# Patient Record
Sex: Male | Born: 1935 | Race: White | Hispanic: No | Marital: Married | Smoking: Never smoker
Health system: Southern US, Community
[De-identification: ages and names within clinical notes are randomized; demographics above are authoritative.]

## PROBLEM LIST (undated history)

## (undated) DIAGNOSIS — R569 Unspecified convulsions: Secondary | ICD-10-CM

## (undated) DIAGNOSIS — Z5189 Encounter for other specified aftercare: Secondary | ICD-10-CM

## (undated) HISTORY — PX: CARDIAC SURGERY: SHX584

## (undated) HISTORY — PX: PACEMAKER INSERTION: SHX728

---

## 2017-12-19 ENCOUNTER — Other Ambulatory Visit: Payer: Self-pay

## 2017-12-19 ENCOUNTER — Emergency Department: Payer: Medicare Other

## 2017-12-19 ENCOUNTER — Encounter: Payer: Self-pay | Admitting: Emergency Medicine

## 2017-12-19 ENCOUNTER — Emergency Department
Admission: EM | Admit: 2017-12-19 | Discharge: 2017-12-19 | Disposition: A | Discharge status: Home / Self Care | Payer: Medicare Other | Attending: Emergency Medicine | Admitting: Emergency Medicine

## 2017-12-19 DIAGNOSIS — R609 Edema, unspecified: Secondary | ICD-10-CM | POA: Insufficient documentation

## 2017-12-19 DIAGNOSIS — R0600 Dyspnea, unspecified: Secondary | ICD-10-CM | POA: Insufficient documentation

## 2017-12-19 DIAGNOSIS — I110 Hypertensive heart disease with heart failure: Secondary | ICD-10-CM | POA: Diagnosis not present

## 2017-12-19 DIAGNOSIS — Z950 Presence of cardiac pacemaker: Secondary | ICD-10-CM | POA: Diagnosis not present

## 2017-12-19 DIAGNOSIS — I509 Heart failure, unspecified: Secondary | ICD-10-CM | POA: Diagnosis not present

## 2017-12-19 DIAGNOSIS — R0602 Shortness of breath: Secondary | ICD-10-CM | POA: Diagnosis present

## 2017-12-19 HISTORY — DX: Unspecified convulsions: R56.9

## 2017-12-19 HISTORY — DX: Encounter for other specified aftercare: Z51.89

## 2017-12-19 LAB — COMPREHENSIVE METABOLIC PANEL
ALBUMIN: 3.8 g/dL (ref 3.5–5.0)
ALK PHOS: 143 U/L — AB (ref 38–126)
ALT: 18 U/L (ref 0–44)
AST: 30 U/L (ref 15–41)
Anion gap: 9 (ref 5–15)
BILIRUBIN TOTAL: 1.4 mg/dL — AB (ref 0.3–1.2)
BUN: 19 mg/dL (ref 8–23)
CO2: 24 mmol/L (ref 22–32)
CREATININE: 1.02 mg/dL (ref 0.61–1.24)
Calcium: 9.3 mg/dL (ref 8.9–10.3)
Chloride: 109 mmol/L (ref 98–111)
GFR calc Af Amer: >60 mL/min (ref >60)
Glucose, Bld: 131 mg/dL — ABNORMAL HIGH (ref 70–99)
POTASSIUM: 4.2 mmol/L (ref 3.5–5.1)
Sodium: 142 mmol/L (ref 135–145)
TOTAL PROTEIN: 7.6 g/dL (ref 6.5–8.1)

## 2017-12-19 LAB — CBC
HEMATOCRIT: 37.5 % — AB (ref 40.0–52.0)
HEMOGLOBIN: 12.7 g/dL — AB (ref 13.0–18.0)
MCH: 32.2 pg (ref 26.0–34.0)
MCHC: 33.8 g/dL (ref 32.0–36.0)
MCV: 95.2 fL (ref 80.0–100.0)
Platelets: 166 10*3/uL (ref 150–440)
RBC: 3.94 MIL/uL — AB (ref 4.40–5.90)
RDW: 14.6 % — ABNORMAL HIGH (ref 11.5–14.5)
WBC: 5.3 10*3/uL (ref 3.8–10.6)

## 2017-12-19 LAB — URINALYSIS, COMPLETE (UACMP) WITH MICROSCOPIC
Bacteria, UA: NONE SEEN
Bilirubin Urine: NEGATIVE
GLUCOSE, UA: NEGATIVE mg/dL
HGB URINE DIPSTICK: NEGATIVE
Ketones, ur: NEGATIVE mg/dL
LEUKOCYTES UA: NEGATIVE
Nitrite: NEGATIVE
PROTEIN: NEGATIVE mg/dL
SPECIFIC GRAVITY, URINE: 1.005 (ref 1.005–1.030)
Squamous Epithelial / LPF: NONE SEEN (ref 0–5)
pH: 7 (ref 5.0–8.0)

## 2017-12-19 LAB — TROPONIN I: Troponin I: 0.04 ng/mL (ref <0.03)

## 2017-12-19 LAB — BRAIN NATRIURETIC PEPTIDE: B NATRIURETIC PEPTIDE 5: 588 pg/mL — AB (ref 0.0–100.0)

## 2017-12-19 MED ORDER — FUROSEMIDE 10 MG/ML IJ SOLN
60.0000 mg | Freq: Once | INTRAMUSCULAR | Status: AC
Start: 2017-12-19 — End: 2017-12-19
  Administered 2017-12-19: 60 mg via INTRAVENOUS
  Filled 2017-12-19: qty 8

## 2017-12-19 MED ORDER — FUROSEMIDE 20 MG PO TABS: 20.0000 mg | ORAL_TABLET | Freq: Every day | ORAL | 0 refills | Status: DC

## 2017-12-19 NOTE — ED Provider Notes (Signed)
[facility] Emergency Department Provider Note  Time seen: 4:16 PM  I have reviewed the triage vital signs and the nursing notes.   HISTORY  Chief Complaint Shortness of Breath    HPI Jacob Mahoney is a 84 y.o. male with a past medical history of CHF/peripheral edema, hypertension, seizure disorder, presents to the emergency department for shortness of breath.  According to the daughter patient lives in a nursing facility.  Over the past 2 to 3 days the patient has had worsening shortness of breath especially with exertion.  Denies cough or congestion.  Denies fever.  Denies chest pain or abdominal pain.  Does state increased lower extremity swelling especially in the left lower extremity.  Describes his shortness of breath is moderate with exertion, still able to walk to the dining hall from his room but it has become increasingly difficult over the past few days per daughter.   Past Medical History:  Diagnosis Date  . Blood transfusion without reported diagnosis   . Seizures (HCC)     There are no active problems to display for this patient.   Past Surgical History:  Procedure Laterality Date  . CARDIAC SURGERY    . PACEMAKER INSERTION      Prior to Admission medications   Not on File    Allergies  Allergen Reactions  . Amiodarone Rash    History reviewed. No pertinent family history.  Social History Social History   Tobacco Use  . Smoking status: Never Smoker  . Smokeless tobacco: Never Used  Substance Use Topics  . Alcohol use: Never    Frequency: Never  . Drug use: Never    Review of Systems Constitutional: Negative for fever. Eyes: Negative for visual complaints ENT: Negative for recent illness/congestion Cardiovascular: Negative for chest pain. Respiratory: As if her shortness of breath, worse with exertion. Gastrointestinal: Negative for abdominal pain, vomiting Genitourinary: Dark urine. Musculoskeletal: As if her lower  extremity edema left greater than right. Skin: Negative for skin complaints  Neurological: Negative for headache All other ROS negative  ____________________________________________   PHYSICAL EXAM:  VITAL SIGNS: ED Triage Vitals  Enc Vitals Group     BP 12/19/17 1443 (!) 153/88     Pulse Rate 12/19/17 1443 61     Resp 12/19/17 1443 16     Temp 12/19/17 1443 98.1 F (36.7 C)     Temp Source 12/19/17 1443 Oral     SpO2 12/19/17 1443 97 %     Weight 12/19/17 1445 200 lb (90.7 kg)     Height 12/19/17 1445 6' (1.829 m)     Head Circumference --      Peak Flow --      Pain Score 12/19/17 1445 0     Pain Loc --      Pain Edu? --      Excl. in GC? --    Constitutional: Alert and oriented. Well appearing and in no distress. Eyes: Normal exam ENT   Head: Normocephalic and atraumatic   Mouth/Throat: Mucous membranes are moist. Cardiovascular: Normal rate, regular rhythm.  Respiratory: Normal respiratory effort without tachypnea nor retractions. Breath sounds are clear without obvious wheeze rales or rhonchi. Gastrointestinal: Soft and nontender. No distention.   Musculoskeletal: 2+ lower extremity edema in the left lower extremity, 1+ in the right lower extremity.  Mild erythema of the left lower extremity, no significant tenderness. Neurologic:  Normal speech and language. No gross focal neurologic deficits  Skin:  Skin is  warm, dry and intact.  Psychiatric: Mood and affect are normal.  ____________________________________________    EKG  EKG reviewed and interpreted by myself shows a ventricular paced rhythm at 66 bpm with a widened QRS, left axis deviation, QTC prolongation and nonspecific ST changes  ____________________________________________    RADIOLOGY  Chest x-ray consistent with vascular congestion  ____________________________________________   INITIAL IMPRESSION / ASSESSMENT AND PLAN / ED COURSE  Pertinent labs & imaging results that were  available during my care of the patient were reviewed by me and considered in my medical decision making (see chart for details).  Patient presents to the emergency department for shortness of breath worse over the past 2 to 3 days.  Differential would include CHF exacerbation, pulmonary edema, ACS, alleged letter metabolic abnormality, infectious etiology.  Patient's labs are resulted showing an elevated BNP of 588, vascular congestion on chest x-ray with increased lower extremity edema, left greater than right.  We will obtain an ultrasound to help rule out DVT.  Chest x-ray shows vascular congestion highly suspect mild CHF exacerbation to be the cause of the patient's symptoms.  Kidney function is normal, letter lites are reassuring.  We will dose IV Lasix, obtain an ultrasound of the left lower extremity as well as a urinalysis.  Patient agreeable to plan of care.  Overall the patient appears well currently satting 98% on room air during my evaluation.  Patient's ultrasound is negative for DVT.  Remainder work-up appears largely nonrevealing.  Overall the patient appears very well, continues to sat in the upper 90s on room air.  We will discharge her on increased Lasix regimen have the patient seen by his primary care doctor later this week.  Daughter and patient are agreeable to this plan of care.  ____________________________________________   FINAL CLINICAL IMPRESSION(S) / ED DIAGNOSES  Dyspnea Peripheral edema   Minna Antis, MD 12/19/17 [phone]

## 2017-12-19 NOTE — Discharge Instructions (Signed)
Please take your Lasix as prescribed each morning over the next 5 days.  Please follow-up with your doctor later this week for recheck/reevaluation.  Return to the emergency department for any worsening trouble breathing development of any chest pain, or any other symptoms personally concerning to yourself.  Please also call the number provided for cardiology to arrange a follow-up appointment for further evaluation and possible echocardiogram/ultrasound of your heart.

## 2017-12-19 NOTE — ED Notes (Signed)
Patient transported to Ultrasound 

## 2017-12-19 NOTE — ED Triage Notes (Signed)
Pt to ED from home c/o SOB x2 weeks getting worse as well as edema in bilateral legs left worse and than right.  Denies pain, speaking in complete and coherent sentences, no obvious distress noted in triage.

## 2019-06-28 ENCOUNTER — Ambulatory Visit: Payer: 59 | Attending: Internal Medicine

## 2019-08-29 ENCOUNTER — Emergency Department: Payer: Medicare HMO

## 2019-08-29 ENCOUNTER — Observation Stay
Admission: EM | Admit: 2019-08-29 | Discharge: 2019-08-31 | Disposition: A | Discharge status: Home / Self Care | Payer: Medicare HMO | Attending: Internal Medicine | Admitting: Internal Medicine

## 2019-08-29 DIAGNOSIS — I110 Hypertensive heart disease with heart failure: Secondary | ICD-10-CM | POA: Insufficient documentation

## 2019-08-29 DIAGNOSIS — I10 Essential (primary) hypertension: Secondary | ICD-10-CM

## 2019-08-29 DIAGNOSIS — L03119 Cellulitis of unspecified part of limb: Secondary | ICD-10-CM | POA: Diagnosis not present

## 2019-08-29 DIAGNOSIS — J90 Pleural effusion, not elsewhere classified: Secondary | ICD-10-CM

## 2019-08-29 DIAGNOSIS — K219 Gastro-esophageal reflux disease without esophagitis: Secondary | ICD-10-CM

## 2019-08-29 DIAGNOSIS — R4189 Other symptoms and signs involving cognitive functions and awareness: Secondary | ICD-10-CM | POA: Diagnosis not present

## 2019-08-29 DIAGNOSIS — D649 Anemia, unspecified: Secondary | ICD-10-CM | POA: Diagnosis not present

## 2019-08-29 DIAGNOSIS — Z950 Presence of cardiac pacemaker: Secondary | ICD-10-CM | POA: Diagnosis not present

## 2019-08-29 DIAGNOSIS — R4182 Altered mental status, unspecified: Principal | ICD-10-CM | POA: Insufficient documentation

## 2019-08-29 DIAGNOSIS — I5023 Acute on chronic systolic (congestive) heart failure: Secondary | ICD-10-CM | POA: Diagnosis not present

## 2019-08-29 DIAGNOSIS — Z79899 Other long term (current) drug therapy: Secondary | ICD-10-CM | POA: Diagnosis not present

## 2019-08-29 DIAGNOSIS — I5021 Acute systolic (congestive) heart failure: Secondary | ICD-10-CM

## 2019-08-29 DIAGNOSIS — R0602 Shortness of breath: Secondary | ICD-10-CM | POA: Diagnosis present

## 2019-08-29 DIAGNOSIS — Z66 Do not resuscitate: Secondary | ICD-10-CM | POA: Diagnosis not present

## 2019-08-29 DIAGNOSIS — L0390 Cellulitis, unspecified: Secondary | ICD-10-CM

## 2019-08-29 DIAGNOSIS — Z952 Presence of prosthetic heart valve: Secondary | ICD-10-CM | POA: Insufficient documentation

## 2019-08-29 DIAGNOSIS — I501 Left ventricular failure: Secondary | ICD-10-CM

## 2019-08-29 DIAGNOSIS — Z20822 Contact with and (suspected) exposure to covid-19: Secondary | ICD-10-CM | POA: Diagnosis not present

## 2019-08-29 DIAGNOSIS — I509 Heart failure, unspecified: Secondary | ICD-10-CM

## 2019-08-29 DIAGNOSIS — L03116 Cellulitis of left lower limb: Secondary | ICD-10-CM | POA: Diagnosis not present

## 2019-08-29 LAB — TROPONIN I (HIGH SENSITIVITY)
Troponin I (High Sensitivity): 49 ng/L — ABNORMAL HIGH (ref <18)
Troponin I (High Sensitivity): 53 ng/L — ABNORMAL HIGH (ref <18)

## 2019-08-29 LAB — CBC WITH DIFFERENTIAL/PLATELET
Abs Immature Granulocytes: 0.02 10*3/uL (ref 0.00–0.07)
Basophils Absolute: 0.1 10*3/uL (ref 0.0–0.1)
Basophils Relative: 1 %
Eosinophils Absolute: 0.2 10*3/uL (ref 0.0–0.5)
Eosinophils Relative: 3 %
HCT: 36 % — ABNORMAL LOW (ref 39.0–52.0)
Hemoglobin: 11.5 g/dL — ABNORMAL LOW (ref 13.0–17.0)
Immature Granulocytes: 0 %
Lymphocytes Relative: 13 %
Lymphs Abs: 0.9 10*3/uL (ref 0.7–4.0)
MCH: 30.9 pg (ref 26.0–34.0)
MCHC: 31.9 g/dL (ref 30.0–36.0)
MCV: 96.8 fL (ref 80.0–100.0)
Monocytes Absolute: 1.3 10*3/uL — ABNORMAL HIGH (ref 0.1–1.0)
Monocytes Relative: 20 %
Neutro Abs: 4 10*3/uL (ref 1.7–7.7)
Neutrophils Relative %: 63 %
Platelets: 191 10*3/uL (ref 150–400)
RBC: 3.72 MIL/uL — ABNORMAL LOW (ref 4.22–5.81)
RDW: 14.6 % (ref 11.5–15.5)
WBC: 6.4 10*3/uL (ref 4.0–10.5)
nRBC: 0 % (ref 0.0–0.2)

## 2019-08-29 LAB — BASIC METABOLIC PANEL
Anion gap: 6 (ref 5–15)
BUN: 28 mg/dL — ABNORMAL HIGH (ref 8–23)
CO2: 24 mmol/L (ref 22–32)
Calcium: 9.4 mg/dL (ref 8.9–10.3)
Chloride: 108 mmol/L (ref 98–111)
Creatinine, Ser: 1.08 mg/dL (ref 0.61–1.24)
GFR calc Af Amer: >60 mL/min (ref >60)
GFR calc non Af Amer: 58 mL/min — ABNORMAL LOW (ref >60)
Glucose, Bld: 90 mg/dL (ref 70–99)
Potassium: 4.3 mmol/L (ref 3.5–5.1)
Sodium: 138 mmol/L (ref 135–145)

## 2019-08-29 LAB — BRAIN NATRIURETIC PEPTIDE: B Natriuretic Peptide: 535 pg/mL — ABNORMAL HIGH (ref 0.0–100.0)

## 2019-08-29 MED ORDER — ATENOLOL 25 MG PO TABS: 12.5000 mg | ORAL_TABLET | Freq: Every day | ORAL | Status: DC

## 2019-08-29 MED ORDER — DONEPEZIL HCL 5 MG PO TABS
10.0000 mg | ORAL_TABLET | Freq: Every day | ORAL | Status: DC
  Administered 2019-08-29 – 2019-08-30 (×2): 10 mg via ORAL
  Filled 2019-08-29 (×3): qty 2

## 2019-08-29 MED ORDER — ASPIRIN 81 MG PO CHEW
324.0000 mg | CHEWABLE_TABLET | Freq: Once | ORAL | Status: DC
  Filled 2019-08-29: qty 4

## 2019-08-29 MED ORDER — ENOXAPARIN SODIUM 40 MG/0.4ML ~~LOC~~ SOLN
40.0000 mg | SUBCUTANEOUS | Status: DC
  Administered 2019-08-30 (×2): 40 mg via SUBCUTANEOUS
  Filled 2019-08-29 (×2): qty 0.4

## 2019-08-29 MED ORDER — PANTOPRAZOLE SODIUM 40 MG PO TBEC
40.0000 mg | DELAYED_RELEASE_TABLET | Freq: Every day | ORAL | Status: DC
  Administered 2019-08-30 – 2019-08-31 (×2): 40 mg via ORAL
  Filled 2019-08-29 (×2): qty 1

## 2019-08-29 MED ORDER — FUROSEMIDE 10 MG/ML IJ SOLN
40.0000 mg | Freq: Every day | INTRAMUSCULAR | Status: DC
  Administered 2019-08-30 – 2019-08-31 (×2): 40 mg via INTRAVENOUS
  Filled 2019-08-29 (×3): qty 4

## 2019-08-29 MED ORDER — ASPIRIN 81 MG PO CHEW
162.0000 mg | CHEWABLE_TABLET | Freq: Once | ORAL | Status: AC
  Administered 2019-08-29: 162 mg via ORAL

## 2019-08-29 MED ORDER — SODIUM CHLORIDE 0.9 % IV SOLN
1.0000 g | INTRAVENOUS | Status: DC
  Administered 2019-08-29 – 2019-08-30 (×2): 1 g via INTRAVENOUS
  Filled 2019-08-29: qty 10
  Filled 2019-08-29: qty 1
  Filled 2019-08-29: qty 10

## 2019-08-29 MED ORDER — LEVETIRACETAM 500 MG PO TABS
500.0000 mg | ORAL_TABLET | Freq: Two times a day (BID) | ORAL | Status: DC
  Administered 2019-08-29 – 2019-08-31 (×4): 500 mg via ORAL
  Filled 2019-08-29 (×4): qty 1

## 2019-08-29 MED ORDER — ATENOLOL 25 MG PO TABS
12.5000 mg | ORAL_TABLET | Freq: Every day | ORAL | Status: DC
  Administered 2019-08-30 – 2019-08-31 (×3): 12.5 mg via ORAL
  Filled 2019-08-29: qty 1
  Filled 2019-08-29 (×2): qty 0.5

## 2019-08-29 MED ORDER — FUROSEMIDE 10 MG/ML IJ SOLN
60.0000 mg | Freq: Once | INTRAMUSCULAR | Status: AC
  Administered 2019-08-29: 60 mg via INTRAVENOUS
  Filled 2019-08-29: qty 8

## 2019-08-29 MED ORDER — ISOSORBIDE MONONITRATE ER 30 MG PO TB24
30.0000 mg | ORAL_TABLET | Freq: Every day | ORAL | Status: DC
  Administered 2019-08-30 – 2019-08-31 (×2): 30 mg via ORAL
  Filled 2019-08-29 (×2): qty 1

## 2019-08-29 MED ORDER — ASPIRIN 81 MG PO CHEW
81.0000 mg | CHEWABLE_TABLET | Freq: Every day | ORAL | Status: DC
  Administered 2019-08-30 – 2019-08-31 (×2): 81 mg via ORAL
  Filled 2019-08-29 (×2): qty 1

## 2019-08-29 NOTE — ED Notes (Signed)
Pt continued to get up to urinate, condom cath placed, bedding changed, bed alarm active

## 2019-08-29 NOTE — H&P (Signed)
History and Physical    Jacob Mahoney AQT:[id number] DOB: 11-Mar-1923 DOA: 08/29/2019  PCP: Sharilyn Sites, MD  Patient coming from: [facility], has home health aids that helps with medication and daily ADLs  I have personally briefly reviewed patient's old medical records in [facility]  Chief Complaint: altered mental status   HPI: Jacob Mahoney is a 86 y.o. male with medical history significant for chronic systolic heart failure, bradycardia s/p pacemaker, aortic valve replacement, history of GI bleed, seizures disorder and hypertension who presents with concerns of altered mental status.  History obtained mostly from daughter who is his HCPOA since patient gave conflicting stories.  He reports to me that he has been feeling dizzy when lying for the past few days but however told ER physician that he has been having issues with shortness of breath.  Per daughter, patient stays with his wife who has severe dementia in a retirement home and reportedly the housekeeper found him laying on the couch "out of it"  And was smacking his lips.  She then proceeded to get him out of the couch and attempted to ambulate him but he appeared to be short of breath and weak.  Unsure if he had any bowel or urinary incontinent.  Patient does have history of seizure and his last one was about 3 to 4 years ago and symptoms include smacking his lips and twitching of the arm.  He is on Keppra 500 twice daily.  Daughter has noticed that for the past few days he has not quite been "himself."  Patient currently not complaining of any pain.  Denies any chest pain.  States he has mild shortness of breath.  He has noticed increased redness of his left lower extremity and per daughter he was recently diagnosed with cellulitis by his primary care physician and was started on a 10-day course of amoxicillin since 3/4.  ED Course: He was afebrile, normotensive on room air.  No leukocytosis.  Stable anemia  with hemoglobin of 11.5.  Unremarkable BMP with normal creatinine of 1.08. BNP of 535.  Troponin of 49. Chest x-ray shows mild bibasilar subsegmental atelectasis or edema with probable small pleural effusion.  Left lower extremity Doppler ultrasound negative for DVT.  Review of Systems:  Unable to fully obtain as patient is a poor historian unable to recall symptoms  Past Medical History:  Diagnosis Date  . Blood transfusion without reported diagnosis   . Seizures (HCC)     Past Surgical History:  Procedure Laterality Date  . CARDIAC SURGERY    . PACEMAKER INSERTION       reports that he has never smoked. He has never used smokeless tobacco. He reports that he does not drink alcohol or use drugs.  Allergies  Allergen Reactions  . Amiodarone Rash    Unable to obtain family history due to patient's poor recall  Prior to Admission medications   Medication Sig Start Date End Date Taking? Authorizing Provider  amoxicillin (AMOXIL) 500 MG tablet Take 500 mg by mouth 3 (three) times daily.   Yes [provider]  atenolol (TENORMIN) 25 MG tablet Take 12.5 mg by mouth daily.   Yes [provider]  donepezil (ARICEPT) 10 MG tablet Take 10 mg by mouth at bedtime.   Yes [provider]  furosemide (LASIX) 20 MG tablet Take 20 mg by mouth daily.   Yes [provider]  isosorbide mononitrate (IMDUR) 30 MG 24 hr tablet Take 30 mg  by mouth daily. Hold for SBP<90 and /or MAP<70   Yes [provider]  omeprazole (PRILOSEC) 20 MG capsule Take 20 mg by mouth daily.   Yes [provider]    Physical Exam: Vitals:   08/29/19 1750 08/29/19 1900 08/29/19 1930 08/29/19 2000  BP: (!) 119/103 140/85 134/83   Pulse: 74 60 63 64  Resp: 16 17 15    Temp: 97.9 F (36.6 C)     TempSrc: Oral     SpO2: 91% 92% 94% 96%  Weight:      Height:        Constitutional: NAD, calm, comfortable, will elderly pleasant gentleman laying at 45 degree  incline in bed Vitals:   08/29/19 1750 08/29/19 1900 08/29/19 1930 08/29/19 2000  BP: (!) 119/103 140/85 134/83   Pulse: 74 60 63 64  Resp: 16 17 15    Temp: 97.9 F (36.6 C)     TempSrc: Oral     SpO2: 91% 92% 94% 96%  Weight:      Height:       Eyes: PERRL, lids and conjunctivae normal ENMT: Mucous membranes are moist.  Neck: normal, supple Respiratory: clear to auscultation bilaterally, no wheezing, no crackles. Normal respiratory effort on room air Cardiovascular: Regular rate and rhythm, no murmurs / rubs / gallops.  Nonpitting edema of both lower extremity worse on the left.   Abdomen: no tenderness, no masses palpated. Bowel sounds positive.  Musculoskeletal: no clubbing / cyanosis. No joint deformity upper and lower extremities. Good ROM, no contractures. Normal muscle tone.  Skin: Chronic venous stasis changes of the skin of bilateral lower extremity.  Left lower extremity is more circumferentially enlarged compared to the right and has erythema up to the mid pretibial region with increased warmth to touch. Neurologic: CN 2-12 grossly intact. Sensation intact. Strength 5/5 in all 4.  Psychiatric:  Alert and oriented x 4 but has difficulty with recall of current symptoms. Normal pleasant mood.     Labs on Admission: I have personally reviewed following labs and imaging studies  CBC: Recent Labs  Lab 08/29/19 1516  WBC 6.4  NEUTROABS 4.0  HGB 11.5*  HCT 36.0*  MCV 96.8  PLT 585   Basic Metabolic Panel: Recent Labs  Lab 08/29/19 1516  NA 138  K 4.3  CL 108  CO2 24  GLUCOSE 90  BUN 28*  CREATININE 1.08  CALCIUM 9.4   GFR: Estimated Creatinine Clearance: 45.9 mL/min (by C-G formula based on SCr of 1.08 mg/dL). Liver Function Tests: No results for input(s): AST, ALT, ALKPHOS, BILITOT, PROT, ALBUMIN in the last 168 hours. No results for input(s): LIPASE, AMYLASE in the last 168 hours. No results for input(s): AMMONIA in the last 168 hours. Coagulation  Profile: No results for input(s): INR, PROTIME in the last 168 hours. Cardiac Enzymes: No results for input(s): CKTOTAL, CKMB, CKMBINDEX, TROPONINI in the last 168 hours. BNP (last 3 results) No results for input(s): PROBNP in the last 8760 hours. HbA1C: No results for input(s): HGBA1C in the last 72 hours. CBG: No results for input(s): GLUCAP in the last 168 hours. Lipid Profile: No results for input(s): CHOL, HDL, LDLCALC, TRIG, CHOLHDL, LDLDIRECT in the last 72 hours. Thyroid Function Tests: No results for input(s): TSH, T4TOTAL, FREET4, T3FREE, THYROIDAB in the last 72 hours. Anemia Panel: No results for input(s): VITAMINB12, FOLATE, FERRITIN, TIBC, IRON, RETICCTPCT in the last 72 hours. Urine analysis:    Component Value Date/Time   COLORURINE STRAW (A)  12/19/2017 1717   APPEARANCEUR CLEAR (A) 12/19/2017 1717   LABSPEC 1.005 12/19/2017 1717   PHURINE 7.0 12/19/2017 1717   GLUCOSEU NEGATIVE 12/19/2017 1717   HGBUR NEGATIVE 12/19/2017 1717   BILIRUBINUR NEGATIVE 12/19/2017 1717   KETONESUR NEGATIVE 12/19/2017 1717   PROTEINUR NEGATIVE 12/19/2017 1717   NITRITE NEGATIVE 12/19/2017 1717   LEUKOCYTESUR NEGATIVE 12/19/2017 1717    Radiological Exams on Admission: DG Chest 2 View  Result Date: 08/29/2019 CLINICAL DATA:  Shortness of breath. EXAM: CHEST - 2 VIEW COMPARISON:  December 19, 2017. FINDINGS: Stable cardiomegaly. Atherosclerosis of thoracic aorta is noted. Left-sided pacemaker is unchanged. Status post aortic valve repair. No pneumothorax is noted. Mild bibasilar subsegmental atelectasis or edema is noted with probable small pleural effusions. Bony thorax is unremarkable. IMPRESSION: Mild bibasilar subsegmental atelectasis or edema is noted with probable small pleural effusions. Aortic Atherosclerosis (ICD10-I70.0). Electronically Signed   By: Lupita Raider M.D.   On: 08/29/2019 17:00   US Venous Img Lower Unilateral Left  Result Date: 08/29/2019 CLINICAL DATA:  Left lower  extremity edema and pain EXAM: LEFT LOWER EXTREMITY VENOUS DOPPLER ULTRASOUND TECHNIQUE: Gray-scale sonography with graded compression, as well as color Doppler and duplex ultrasound were performed to evaluate the lower extremity deep venous systems from the level of the common femoral vein and including the common femoral, femoral, profunda femoral, popliteal and calf veins including the posterior tibial, peroneal and gastrocnemius veins when visible. The superficial great saphenous vein was also interrogated. Spectral Doppler was utilized to evaluate flow at rest and with distal augmentation maneuvers in the common femoral, femoral and popliteal veins. COMPARISON:  None. FINDINGS: Contralateral Common Femoral Vein: Respiratory phasicity is normal and symmetric with the symptomatic side. No evidence of thrombus. Normal compressibility. Common Femoral Vein: No evidence of thrombus. Normal compressibility, respiratory phasicity and response to augmentation. Saphenofemoral Junction: No evidence of thrombus. Normal compressibility and flow on color Doppler imaging. Profunda Femoral Vein: No evidence of thrombus. Normal compressibility and flow on color Doppler imaging. Femoral Vein: No evidence of thrombus. Normal compressibility, respiratory phasicity and response to augmentation. Popliteal Vein: No evidence of thrombus. Normal compressibility, respiratory phasicity and response to augmentation. Calf Veins: No evidence of thrombus. Normal compressibility and flow on color Doppler imaging. Other Findings: Left popliteal fossa minimally complex thick-walled Baker's cyst measures 3.3 x 1.1 x 3.5 cm. Minor peripheral extremity edema noted. IMPRESSION: No evidence of deep venous thrombosis. 3.5 cm left popliteal fossa Baker's cyst. Electronically Signed   By: Judie Petit.  Shick M.D.   On: 08/29/2019 16:46    EKG: Independently reviewed.   Assessment/Plan  Altered mental status hx concerning for seizure with hx of the same  on Keppra will obtain EEG  pt now back to baseline continue Keppra BID  LE edema celluitis has components of celluitis with chronic venous statis changes Negative doppler ultrasound for DVT He was started on amoxicillin outpatient on 3/4 Start IV Rocephin here  Acute systolic heart failure with hx of pacemaker and aortic valve replacement pt not hypoxic but endorse mild SOB. BNP 535 not overly significant and CXR has questionable small pleural effusion Will trial lasix IV 40mg  daily- daughter over gives it MWF monitor Is and Os Daily weights  obtain echocardiogram Continue Imdur   Chronic anemia Stable hemoglobin  Hypertension  Continue atenolol   Cognitive impairment continue donepezil   GERD continue PPI  DVT prophylaxis:.Lovenox Code Status: DNR Family Communication: Plan discussed with patient at bedside and with daughter over the  phone  disposition Plan: Home with at least 2 midnight stays  Consults called:  Admission status: inpatient   Kimeka Badour T Kyle Luppino DO [facility]   If 7PM-7AM, please contact night-coverage [email or web address]   08/29/2019, 8:17 PM

## 2019-08-29 NOTE — ED Notes (Signed)
Patient had gotten up by himself and was using the restroom. Educated patient on importance to use the call bell for safety. Yellow socks applied, DNR band applied. Patient had 2 urine occurrences. Patient was worried that he needed to walk upstairs to get to his admitted bed, told patient he does not have a bed yet but this RN will keep him updated and will take him upstairs when he does get assigned a bed

## 2019-08-29 NOTE — ED Provider Notes (Signed)
[facility] Emergency Department Provider Note   ____________________________________________   First MD Initiated Contact with Patient 08/29/19 1543     (approximate)  I have reviewed the triage vital signs and the nursing notes.   HISTORY  Chief Complaint Shortness of Breath    HPI Jacob Mahoney is a 86 y.o. male   for evaluation of shortness of breath.  Patient reports he started feeling short of breath somewhat over the last month, but over the last couple days has been significantly worse.  Today in particular.  He denies any pain no chest pain.  He did notice his left leg swelled up when he got a couple small blisters that ruptured on it.  Describes him as "blood blisters".  No numbness or weakness in his legs suffer chronic numbness in both lower legs he has had for over a year  Reports he is somewhat short of breath.  Denies Covid exposure.  No fevers or chills.  Denies productive cough.  No body aches.  Does take fluid pills, has a history of a previous heart valve replacement and pacemaker.  Previously cared for in [facility].  Denies use of blood thinners  Past Medical History:  Diagnosis Date  . Blood transfusion without reported diagnosis   . Seizures (HCC)     There are no problems to display for this patient.   Past Surgical History:  Procedure Laterality Date  . CARDIAC SURGERY    . PACEMAKER INSERTION      Prior to Admission medications   Medication Sig Start Date End Date Taking? Authorizing Provider  furosemide (LASIX) 20 MG tablet Take 1 tablet (20 mg total) by mouth daily for 5 days. 12/19/17 12/24/17  Minna Antis, MD    Allergies Amiodarone  History reviewed. No pertinent family history.  Social History Social History   Tobacco Use  . Smoking status: Never Smoker  . Smokeless tobacco: Never Used  Substance Use Topics  . Alcohol use: Never  . Drug use: Never    Review of Systems Constitutional:  No fever/chills Eyes: No visual changes. ENT: No sore throat. Cardiovascular: Denies chest pain. Respiratory: See HPI  gastrointestinal: No abdominal pain.   Genitourinary: Negative for dysuria. Musculoskeletal: Negative for back pain.  See HPI regarding left leg swelling Skin: Negative for rash.  See HPI Neurological: Negative for headaches or new weakness.    ____________________________________________   PHYSICAL EXAM:  VITAL SIGNS: ED Triage Vitals  Enc Vitals Group     BP 08/29/19 1523 (!) 127/107     Pulse Rate 08/29/19 1523 60     Resp 08/29/19 1523 (!) 23     Temp --      Temp Source 08/29/19 1523 Oral     SpO2 08/29/19 1523 94 %     Weight 08/29/19 1525 185 lb (83.9 kg)     Height 08/29/19 1525 6' (1.829 m)     Head Circumference --      Peak Flow --      Pain Score 08/29/19 1524 0     Pain Loc --      Pain Edu? --      Excl. in GC? --     Constitutional: Alert and oriented. Well appearing and in no acute distress. Eyes: Conjunctivae are normal. Head: Atraumatic. Nose: No congestion/rhinnorhea. Mouth/Throat: Mucous membranes are moist. Neck: No stridor.  Cardiovascular: Normal rate, regular rhythm. Grossly normal heart sounds.  Good peripheral circulation. Respiratory: Normal respiratory effort.  No retractions. Lungs CTAB though diminished in the bases bilateral. Gastrointestinal: Soft and nontender. No distention. Musculoskeletal: No lower extremity tenderness though his left lower extremity appears slightly edematous from about the mid thigh down.  Somewhat slowed capillary refill.  Strong palpable dorsalis pedis pulse.  Slow capillary refill in the left lower extremity though.  Not warm to touch, not erythematous, has a slight appearance of venous congestion now Neurologic:  Normal speech and language. No gross focal neurologic deficits are appreciated.  Skin:  Skin is warm, dry and intact. No rash noted. Psychiatric: Mood and affect are normal. Speech  and behavior are normal.  ____________________________________________   LABS (all labs ordered are listed, but only abnormal results are displayed)  Labs Reviewed  BRAIN NATRIURETIC PEPTIDE - Abnormal; Notable for the following components:      Result Value   B Natriuretic Peptide 535.0 (*)    All other components within normal limits  CBC WITH DIFFERENTIAL/PLATELET - Abnormal; Notable for the following components:   RBC 3.72 (*)    Hemoglobin 11.5 (*)    HCT 36.0 (*)    Monocytes Absolute 1.3 (*)    All other components within normal limits  BASIC METABOLIC PANEL - Abnormal; Notable for the following components:   BUN 28 (*)    GFR calc non Af Amer 58 (*)    All other components within normal limits  TROPONIN I (HIGH SENSITIVITY) - Abnormal; Notable for the following components:   Troponin I (High Sensitivity) 49 (*)    All other components within normal limits  SARS CORONAVIRUS 2 (TAT 6-24 HRS)  TROPONIN I (HIGH SENSITIVITY)   ____________________________________________  EKG  Reviewed inter by me at 1530 Heart rate 60 QRS 180 QTc 520 Left bundle branch block. (? V-paced) ____________________________________________  RADIOLOGY  DG Chest 2 View    Imaging studies reviewed, most notable for possible atelectasis or edema and as well as small pleural effusions on chest x-ray.  In his clinical setting I suspect this is likely related to pulmonary edema or volume overload possible CHF exacerbation.  ____________________________________________   PROCEDURES  Procedure(s) performed: None  Procedures  Critical Care performed: No  ____________________________________________   INITIAL IMPRESSION / ASSESSMENT AND PLAN / ED COURSE  Pertinent labs & imaging results that were available during my care of the patient were reviewed by me and considered in my medical decision making (see chart for details).   Shortness of breath.  Somewhat worsening over a months  time, does appear somewhat edematous with some venous congestion left lower extremity.  Diminished lung sounds.  Does have history of aortic valve replacement  Left bundle branch block on EKG, possibly paced though I do not see obvious pacer spikes.  No infectious symptoms.  Denies chest pain.  On lab review, most notable for elevated BNP as well as an elevated troponin.  Which based on her algorithm given his dyspnea and elevated troponin I discussed with cardiology and will provide IV Lasix as well as observation, patient has received aspirin as well and took some at home. Discussed and reviewed EKG  / labs and clinical histoory with Dr. Gwen Pounds in ER  Patient understand agreeable plan for admission  Jacob Mahoney was evaluated in Emergency Department on 08/29/2019 for the symptoms described in the history of present illness. He was evaluated in the context of the global COVID-19 pandemic, which necessitated consideration that the patient might be at risk for infection with the SARS-CoV-2 virus that causes COVID-19.  Institutional protocols and algorithms that pertain to the evaluation of patients at risk for COVID-19 are in a state of rapid change based on information released by regulatory bodies including the CDC and federal and state organizations. These policies and algorithms were followed during the patient's care in the ED.       ____________________________________________   FINAL CLINICAL IMPRESSION(S) / ED DIAGNOSES  Final diagnoses:  Acute on chronic congestive heart failure, unspecified heart failure type ([place])  Acute cardiac pulmonary edema (HCC)  Bilateral pleural effusion        Note:  This document was prepared using Dragon voice recognition software and may include unintentional dictation errors       Delman Kitten, MD 08/29/19 Bosie Helper

## 2019-08-29 NOTE — ED Notes (Signed)
Pt attempted to get out of bed to urinate without alerting staff for help as instructed; bed alarm activated for pt safety

## 2019-08-29 NOTE — ED Provider Notes (Signed)
Admit discussed with Dr. Duayne Cal, MD 08/29/19 1836

## 2019-08-29 NOTE — ED Notes (Signed)
Report given to Lupe Carney, RN in CPOD

## 2019-08-29 NOTE — ED Triage Notes (Signed)
EMS from home for SOB, pt states "been a little SOB for a while, worse today". Pt states "had both COVID vaccines".

## 2019-08-30 ENCOUNTER — Observation Stay: Admit: 2019-08-30 | Payer: Medicare HMO

## 2019-08-30 ENCOUNTER — Observation Stay: Payer: Medicare HMO

## 2019-08-30 ENCOUNTER — Observation Stay
Admit: 2019-08-30 | Discharge: 2019-08-30 | Disposition: A | Discharge status: Home / Self Care | Payer: Medicare HMO | Attending: Internal Medicine | Admitting: Internal Medicine

## 2019-08-30 DIAGNOSIS — D649 Anemia, unspecified: Secondary | ICD-10-CM | POA: Diagnosis not present

## 2019-08-30 DIAGNOSIS — R4182 Altered mental status, unspecified: Secondary | ICD-10-CM | POA: Diagnosis not present

## 2019-08-30 DIAGNOSIS — L03116 Cellulitis of left lower limb: Secondary | ICD-10-CM | POA: Diagnosis not present

## 2019-08-30 LAB — COMPREHENSIVE METABOLIC PANEL
ALT: 17 U/L (ref 0–44)
AST: 30 U/L (ref 15–41)
Albumin: 3.7 g/dL (ref 3.5–5.0)
Alkaline Phosphatase: 116 U/L (ref 38–126)
Anion gap: 10 (ref 5–15)
BUN: 30 mg/dL — ABNORMAL HIGH (ref 8–23)
CO2: 25 mmol/L (ref 22–32)
Calcium: 9.2 mg/dL (ref 8.9–10.3)
Chloride: 106 mmol/L (ref 98–111)
Creatinine, Ser: 1.16 mg/dL (ref 0.61–1.24)
GFR calc Af Amer: >60 mL/min (ref >60)
GFR calc non Af Amer: 54 mL/min — ABNORMAL LOW (ref >60)
Glucose, Bld: 96 mg/dL (ref 70–99)
Potassium: 3.9 mmol/L (ref 3.5–5.1)
Sodium: 141 mmol/L (ref 135–145)
Total Bilirubin: 1.2 mg/dL (ref 0.3–1.2)
Total Protein: 7.4 g/dL (ref 6.5–8.1)

## 2019-08-30 LAB — TSH: TSH: 4.186 u[IU]/mL (ref 0.350–4.500)

## 2019-08-30 LAB — CBC
HCT: 36.4 % — ABNORMAL LOW (ref 39.0–52.0)
Hemoglobin: 11.9 g/dL — ABNORMAL LOW (ref 13.0–17.0)
MCH: 31.1 pg (ref 26.0–34.0)
MCHC: 32.7 g/dL (ref 30.0–36.0)
MCV: 95 fL (ref 80.0–100.0)
Platelets: 175 10*3/uL (ref 150–400)
RBC: 3.83 MIL/uL — ABNORMAL LOW (ref 4.22–5.81)
RDW: 14.7 % (ref 11.5–15.5)
WBC: 6.2 10*3/uL (ref 4.0–10.5)
nRBC: 0 % (ref 0.0–0.2)

## 2019-08-30 LAB — TROPONIN I (HIGH SENSITIVITY)
Troponin I (High Sensitivity): 51 ng/L — ABNORMAL HIGH (ref <18)
Troponin I (High Sensitivity): 47 ng/L — ABNORMAL HIGH (ref <18)

## 2019-08-30 LAB — SARS CORONAVIRUS 2 (TAT 6-24 HRS): SARS Coronavirus 2: NEGATIVE

## 2019-08-30 LAB — MRSA PCR SCREENING: MRSA by PCR: NEGATIVE

## 2019-08-30 MED ORDER — SODIUM CHLORIDE 0.9 % IV SOLN
INTRAVENOUS | Status: DC | PRN
  Administered 2019-08-30: 500 mL via INTRAVENOUS

## 2019-08-30 NOTE — Progress Notes (Signed)
EEG completed, results pending. 

## 2019-08-30 NOTE — Care Management Obs Status (Signed)
MEDICARE OBSERVATION STATUS NOTIFICATION   Patient Details  Name: Jacob Mahoney MRN: [id number] Date of Birth: 05-Feb-1923   Medicare Observation Status Notification Given:  Yes    Trenton Founds, RN 08/30/2019, 2:33 PM

## 2019-08-30 NOTE — Progress Notes (Signed)
PROGRESS NOTE    Jacob Mahoney  WLN:[id number] DOB: Oct 29, 1922 DOA: 08/29/2019 PCP: Orvis Brill, [facility]   Brief Narrative:  Jacob Mahoney is a 86 y.o. male with medical history significant for chronic systolic heart failure, bradycardia s/p pacemaker, aortic valve replacement, history of GI bleed, seizures disorder and hypertension who presents with concerns of altered mental status.  There was some concern about seizure as caregiver found him half hanging from couch and smacking his lips.  EEG negative for any seizure-like activity.  Patient was recently treated for lower extremity cellulitis.  Patient back to his baseline.  Subjective: Patient was feeling better, says that he feels back to his baseline.  He was not sure what happened.  Stating that he was brought to ED to check on his lower extremity.  Denies any fever or chills.  Denies any recent seizure-like activity.  Assessment & Plan:   Principal Problem:   AMS (altered mental status) Active Problems:   Cellulitis   Acute systolic CHF (congestive heart failure) (HCC)   Essential hypertension   Cognitive impairment   GERD (gastroesophageal reflux disease)   Anemia   DNR (do not resuscitate)  Altered mental status/seizure-like activity.  Resolved.  Patient is back to his baseline. EEG shows some encephalopathy and without any seizure-like activity. -Check Keppra level. -Continue home dose of Keppra.  Concern for heart failure with history of pacemaker and aortic valve replacement.  Patient did endorse mild dyspnea.  BNP mildly elevated and chest x-ray with questionable small pleural effusion.  Clinically appears euvolemic. He was started on IV Lasix 40 mg daily. -Echocardiogram pending. -Continue Imdur.  Lower extremity cellulitis.  Recently treated with amoxicillin.  Remained afebrile with no leukocytosis.  Left lower extremity with mild edema and erythema with a small scab on dorsum of left foot. -Continue  ceftriaxone for now.  Chronic anemia Stable hemoglobin  Hypertension  Continue atenolol   Cognitive impairment continue donepezil   GERD continue PPI  Objective: Vitals:   08/30/19 0400 08/30/19 0816 08/30/19 1206 08/30/19 1527  BP:  128/74 125/80 128/85  Pulse:  (!) 58 65 65  Resp:  18  18  Temp:  98.3 F (36.8 C) 98.3 F (36.8 C) 98.3 F (36.8 C)  TempSrc:      SpO2:  94% 97% 99%  Weight: 88.4 kg     Height:        Intake/Output Summary (Last 24 hours) at 08/30/2019 1558 Last data filed at 08/30/2019 1300 Gross per 24 hour  Intake 700 ml  Output 1850 ml  Net -1150 ml   Filed Weights   08/29/19 1525 08/30/19 0400  Weight: 83.9 kg 88.4 kg    Examination:  General exam: Appears calm and comfortable  Respiratory system: Clear to auscultation. Respiratory effort normal. Cardiovascular system: S1 & S2 heard, RRR. No JVD, murmurs, rubs, gallops or clicks. Gastrointestinal system: Soft, nontender, nondistended, bowel sounds positive. Central nervous system: Alert and oriented. No focal neurological deficits.Symmetric 5 x 5 power. Extremities: A small scab on dorsum of left foot with surrounding erythema and edema extending up to lower leg.  No hyperthermia or tenderness.  Pulses intact and symmetrical. Psychiatry: Judgement and insight appear normal. Mood & affect appropriate.    DVT prophylaxis: Lovenox Code Status: DNR Family Communication: Daughter was updated on phone. Disposition Plan: Pending echocardiogram, will go back home. PT recommending outpatient therapy which can be provided at his assisted living facility.  Consultants:   None  Procedures:  Antimicrobials:  Ceftriaxone  Data Reviewed: I have personally reviewed following labs and imaging studies  CBC: Recent Labs  Lab 08/29/19 1516 08/30/19 0323  WBC 6.4 6.2  NEUTROABS 4.0  --   HGB 11.5* 11.9*  HCT 36.0* 36.4*  MCV 96.8 95.0  PLT 191 175   Basic Metabolic Panel: Recent Labs    Lab 08/29/19 1516 08/30/19 0323  NA 138 141  K 4.3 3.9  CL 108 106  CO2 24 25  GLUCOSE 90 96  BUN 28* 30*  CREATININE 1.08 1.16  CALCIUM 9.4 9.2   GFR: Estimated Creatinine Clearance: 42.7 mL/min (by C-G formula based on SCr of 1.16 mg/dL). Liver Function Tests: Recent Labs  Lab 08/30/19 0323  AST 30  ALT 17  ALKPHOS 116  BILITOT 1.2  PROT 7.4  ALBUMIN 3.7   No results for input(s): LIPASE, AMYLASE in the last 168 hours. No results for input(s): AMMONIA in the last 168 hours. Coagulation Profile: No results for input(s): INR, PROTIME in the last 168 hours. Cardiac Enzymes: No results for input(s): CKTOTAL, CKMB, CKMBINDEX, TROPONINI in the last 168 hours. BNP (last 3 results) No results for input(s): PROBNP in the last 8760 hours. HbA1C: No results for input(s): HGBA1C in the last 72 hours. CBG: No results for input(s): GLUCAP in the last 168 hours. Lipid Profile: No results for input(s): CHOL, HDL, LDLCALC, TRIG, CHOLHDL, LDLDIRECT in the last 72 hours. Thyroid Function Tests: Recent Labs    08/30/19 0323  TSH 4.186   Anemia Panel: No results for input(s): VITAMINB12, FOLATE, FERRITIN, TIBC, IRON, RETICCTPCT in the last 72 hours. Sepsis Labs: No results for input(s): PROCALCITON, LATICACIDVEN in the last 168 hours.  Recent Results (from the past 240 hour(s))  SARS CORONAVIRUS 2 (TAT 6-24 HRS) Nasopharyngeal Nasopharyngeal Swab     Status: None   Collection Time: 08/29/19  6:41 PM   Specimen: Nasopharyngeal Swab  Result Value Ref Range Status   SARS Coronavirus 2 NEGATIVE NEGATIVE Final    Comment: (NOTE) SARS-CoV-2 target nucleic acids are NOT DETECTED. The SARS-CoV-2 RNA is generally detectable in upper and lower respiratory specimens during the acute phase of infection. Negative results do not preclude SARS-CoV-2 infection, do not rule out co-infections with other pathogens, and should not be used as the sole basis for treatment or other patient  management decisions. Negative results must be combined with clinical observations, patient history, and epidemiological information. The expected result is Negative. Fact Sheet for Patients: HairSlick.no Fact Sheet for Healthcare Providers: quierodirigir.com This test is not yet approved or cleared by the [place] FDA and  has been authorized for detection and/or diagnosis of SARS-CoV-2 by FDA under an Emergency Use Authorization (EUA). This EUA will remain  in effect (meaning this test can be used) for the duration of the COVID-19 declaration under Section 56 4(b)(1) of the Act, 21 U.S.C. section 360bbb-3(b)(1), unless the authorization is terminated or revoked sooner. Performed at [facility], [address] [place], [place] [address]      Radiology Studies: EEG  Result Date: 08/30/2019 Charlsie Quest, MD     08/30/2019  2:32 PM Patient Name: Jacob Mahoney MRN: [id number] Epilepsy Attending: Charlsie Quest Referring Physician/Provider: Dr Arnetha Courser Date: 08/30/2019 Duration: 23.19 mins Patient history: 86 year old male with history of epilepsy presented with altered mental status.  EEG to evaluate for seizures. Level of alertness: Awake, asleep AEDs during EEG study: Keppra Technical aspects: This EEG study was done with scalp electrodes positioned  according to the 10-20 International system of electrode placement. Electrical activity was acquired at a sampling rate of 500Hz  and reviewed with a high frequency filter of 70Hz  and a low frequency filter of 1Hz . EEG data were recorded continuously and digitally stored. Description: During awake state, no clear posterior dominant rhythm was seen.  Sleep was characterized by vertex waves, sleep spindles (12 to 14 Hz), maximal frontocentral region.  EEG showed continuous generalized polymorphic 5 to 7 Hz theta slowing.  Hyperventilation photic stimulation were not performed.  Abnormalities - Continuous slow, generalized    IMPRESSION: This study is suggestive of mild diffuse encephalopathy, nonspecific etiology. No seizures or epileptiform discharges were seen throughout the recording.   DG Chest 2 View  Result Date: 08/29/2019 CLINICAL DATA:  Shortness of breath. EXAM: CHEST - 2 VIEW COMPARISON:  December 19, 2017. FINDINGS: Stable cardiomegaly. Atherosclerosis of thoracic aorta is noted. Left-sided pacemaker is unchanged. Status post aortic valve repair. No pneumothorax is noted. Mild bibasilar subsegmental atelectasis or edema is noted with probable small pleural effusions. Bony thorax is unremarkable. IMPRESSION: Mild bibasilar subsegmental atelectasis or edema is noted with probable small pleural effusions. Aortic Atherosclerosis (ICD10-I70.0). Electronically Signed   By: Charlsie Quest M.D.   On: 08/29/2019 17:00   December 21, 2017 Venous Img Lower Unilateral Left  Result Date: 08/29/2019 CLINICAL DATA:  Left lower extremity edema and pain EXAM: LEFT LOWER EXTREMITY VENOUS DOPPLER ULTRASOUND TECHNIQUE: Gray-scale sonography with graded compression, as well as color Doppler and duplex ultrasound were performed to evaluate the lower extremity deep venous systems from the level of the common femoral vein and including the common femoral, femoral, profunda femoral, popliteal and calf veins including the posterior tibial, peroneal and gastrocnemius veins when visible. The superficial great saphenous vein was also interrogated. Spectral Doppler was utilized to evaluate flow at rest and with distal augmentation maneuvers in the common femoral, femoral and popliteal veins. COMPARISON:  None. FINDINGS: Contralateral Common Femoral Vein: Respiratory phasicity is normal and symmetric with the symptomatic side. No evidence of thrombus. Normal compressibility. Common Femoral Vein: No evidence of thrombus. Normal compressibility, respiratory phasicity and response to augmentation.  Saphenofemoral Junction: No evidence of thrombus. Normal compressibility and flow on color Doppler imaging. Profunda Femoral Vein: No evidence of thrombus. Normal compressibility and flow on color Doppler imaging. Femoral Vein: No evidence of thrombus. Normal compressibility, respiratory phasicity and response to augmentation. Popliteal Vein: No evidence of thrombus. Normal compressibility, respiratory phasicity and response to augmentation. Calf Veins: No evidence of thrombus. Normal compressibility and flow on color Doppler imaging. Other Findings: Left popliteal fossa minimally complex thick-walled Baker's cyst measures 3.3 x 1.1 x 3.5 cm. Minor peripheral extremity edema noted. IMPRESSION: No evidence of deep venous thrombosis. 3.5 cm left popliteal fossa Baker's cyst. Electronically Signed   By: 10/29/2019.  Shick M.D.   On: 08/29/2019 16:46    Scheduled Meds: . aspirin  81 mg Oral Daily  . atenolol  12.5 mg Oral Daily  . donepezil  10 mg Oral QHS  . enoxaparin (LOVENOX) injection  40 mg Subcutaneous Q24H  . furosemide  40 mg Intravenous Daily  . isosorbide mononitrate  30 mg Oral Daily  . levETIRAcetam  500 mg Oral BID  . pantoprazole  40 mg Oral Daily   Continuous Infusions: . cefTRIAXone (ROCEPHIN)  IV Stopped (08/29/19 2200)     LOS: 1 day   Time spent: 40 minutes.  Judie Petit, MD [facility]  If 7PM-7AM, please contact night-coverage  [email or web address]  08/30/2019, 3:58 PM   This record has been created using Conservation officer, historic buildings. Errors have been sought and corrected,but may not always be located. Such creation errors do not reflect on the standard of care.

## 2019-08-30 NOTE — TOC Initial Note (Signed)
Transition of Care [facility]) - Initial/Assessment Note    Patient Details  Name: Eann Cleland MRN: [id number] Date of Birth: 09/20/1922  [facility] CM/SW Contact:    Trenton Founds, RN Phone Number: 08/30/2019, 2:34 PM  Clinical Narrative:    RNCM spoke with patient's daughter over the phone. Patient lives at [facility] with his wife. Daughter is currently taking patient to her home in [place] while he is in the hospital. Patient has now been changed to observation status and this was explained to her. Daughter reports that when patient is ready to go home she will pick him up. No other needs identified at this time, CM will remain available for any further needs.                   Patient Goals and CMS Choice        Expected Discharge Plan and Services                                                Prior Living Arrangements/Services                       Activities of Daily Living      Permission Sought/Granted                  Emotional Assessment              Admission diagnosis:  Bilateral pleural effusion [J90] Acute cardiac pulmonary edema (HCC) [I50.1] Acute on chronic congestive heart failure, unspecified heart failure type (HCC) [I50.9] AMS (altered mental status) [R41.82] Patient Active Problem List   Diagnosis Date Noted  . AMS (altered mental status) 08/29/2019  . Cellulitis 08/29/2019  . Acute systolic CHF (congestive heart failure) (HCC) 08/29/2019  . Essential hypertension 08/29/2019  . Cognitive impairment 08/29/2019  . GERD (gastroesophageal reflux disease) 08/29/2019  . Anemia 08/29/2019  . DNR (do not resuscitate) 08/29/2019   PCP:  Almetta Lovely, [facility] Pharmacy:   [id number] [place], [place] - [address] [place] [address] Phone: [phone] Fax: [phone]     Social Determinants of Health (SDOH) Interventions    Readmission Risk  Interventions No flowsheet data found.

## 2019-08-30 NOTE — Procedures (Signed)
Patient Name: Jacob Mahoney  MRN: [id number]  Epilepsy Attending: Charlsie Quest  Referring Physician/Provider: Dr Arnetha Courser Date: 08/30/2019 Duration: 23.19 mins  Patient history: 86 year old male with history of epilepsy presented with altered mental status.  EEG to evaluate for seizures.  Level of alertness: Awake, asleep  AEDs during EEG study: Keppra  Technical aspects: This EEG study was done with scalp electrodes positioned according to the 10-20 International system of electrode placement. Electrical activity was acquired at a sampling rate of 500Hz  and reviewed with a high frequency filter of 70Hz  and a low frequency filter of 1Hz . EEG data were recorded continuously and digitally stored.   Description: During awake state, no clear posterior dominant rhythm was seen.  Sleep was characterized by vertex waves, sleep spindles (12 to 14 Hz), maximal frontocentral region.  EEG showed continuous generalized polymorphic 5 to 7 Hz theta slowing.  Hyperventilation photic stimulation were not performed.  Abnormalities - Continuous slow, generalized      IMPRESSION: This study is suggestive of mild diffuse encephalopathy, nonspecific etiology. No seizures or epileptiform discharges were seen throughout the recording.  [name] 

## 2019-08-31 DIAGNOSIS — I5021 Acute systolic (congestive) heart failure: Secondary | ICD-10-CM | POA: Diagnosis not present

## 2019-08-31 DIAGNOSIS — D649 Anemia, unspecified: Secondary | ICD-10-CM | POA: Diagnosis not present

## 2019-08-31 DIAGNOSIS — R4189 Other symptoms and signs involving cognitive functions and awareness: Secondary | ICD-10-CM | POA: Diagnosis not present

## 2019-08-31 DIAGNOSIS — I509 Heart failure, unspecified: Secondary | ICD-10-CM

## 2019-08-31 LAB — CBC
HCT: 36.9 % — ABNORMAL LOW (ref 39.0–52.0)
Hemoglobin: 12.1 g/dL — ABNORMAL LOW (ref 13.0–17.0)
MCH: 30.9 pg (ref 26.0–34.0)
MCHC: 32.8 g/dL (ref 30.0–36.0)
MCV: 94.1 fL (ref 80.0–100.0)
Platelets: 177 10*3/uL (ref 150–400)
RBC: 3.92 MIL/uL — ABNORMAL LOW (ref 4.22–5.81)
RDW: 14.6 % (ref 11.5–15.5)
WBC: 6.2 10*3/uL (ref 4.0–10.5)
nRBC: 0 % (ref 0.0–0.2)

## 2019-08-31 LAB — ECHOCARDIOGRAM COMPLETE
Height: 72 in
Weight: 3118.19 oz

## 2019-08-31 LAB — BASIC METABOLIC PANEL
Anion gap: 9 (ref 5–15)
BUN: 33 mg/dL — ABNORMAL HIGH (ref 8–23)
CO2: 26 mmol/L (ref 22–32)
Calcium: 9.2 mg/dL (ref 8.9–10.3)
Chloride: 104 mmol/L (ref 98–111)
Creatinine, Ser: 1.22 mg/dL (ref 0.61–1.24)
GFR calc Af Amer: 58 mL/min — ABNORMAL LOW (ref >60)
GFR calc non Af Amer: 50 mL/min — ABNORMAL LOW (ref >60)
Glucose, Bld: 91 mg/dL (ref 70–99)
Potassium: 4 mmol/L (ref 3.5–5.1)
Sodium: 139 mmol/L (ref 135–145)

## 2019-08-31 MED ORDER — LEVETIRACETAM 500 MG PO TABS: 500.0000 mg | ORAL_TABLET | Freq: Two times a day (BID) | ORAL | 0 refills | Status: AC

## 2019-08-31 MED ORDER — LOSARTAN POTASSIUM 25 MG PO TABS: 25.0000 mg | ORAL_TABLET | Freq: Every day | ORAL | 0 refills | Status: AC

## 2019-08-31 MED ORDER — ASPIRIN 81 MG PO CHEW: 81.0000 mg | CHEWABLE_TABLET | Freq: Every day | ORAL | 0 refills | Status: AC

## 2019-08-31 MED ORDER — FUROSEMIDE 40 MG PO TABS: 40.0000 mg | ORAL_TABLET | Freq: Every day | ORAL | 0 refills | Status: AC

## 2019-08-31 MED ORDER — LOSARTAN POTASSIUM 25 MG PO TABS
25.0000 mg | ORAL_TABLET | Freq: Every day | ORAL | Status: DC
  Administered 2019-08-31: 25 mg via ORAL
  Filled 2019-08-31: qty 1

## 2019-08-31 NOTE — Discharge Summary (Signed)
Physician Discharge Summary  Elva Breaker PNT:[id number] DOB: [birth year] DOA: 08/29/2019  PCP: Almetta Lovely, [facility]  Admit date: 08/29/2019 Discharge date: 08/31/2019  Admitted From: Home Disposition: Home  Recommendations for Outpatient Follow-up:  1. Follow up with PCP in 1-2 weeks 2. Follow-up with your cardiologist. 3. Follow-up with your neurologist. 4. Please obtain BMP/CBC in one week 5. Please follow up on the following pending results: Keppra level.  Home Health: No Equipment/Devices: None Discharge Condition: Stable CODE STATUS: DNR Diet recommendation: Heart Healthy   Brief/Interim Summary: Jacob Cooperis a 86 y.o.malewith medical history significant forchronic systolic heart failure, bradycardia s/p pacemaker, aortic valve replacement, history of GI bleed, seizures disorder and hypertension who presents with concerns of altered mental status.  There was some concern about seizure as caregiver found him half hanging from couch and smacking his lips.  EEG negative for any seizure-like activity.  Patient was recently treated for lower extremity cellulitis.  Patient back to his baseline. He was given ceftriaxone for 3 days while in hospital for lower extremity cellulitis.  Symptoms resolved.  He remained afebrile with no leukocytosis.  During a work-up for altered mental status an echo was obtained which shows EF of 35 to 40%.  Patient without any chest pain or shortness of breath.  Discussed with cardiology and they will see him as an outpatient next week as patient is completely asymptomatic.  He will take his diuretics daily now and losartan 25 mg was added to his regimen for HFrEF management.  No prior echo in the record.  Patient has a pacemaker and extensive cardiac history.  Patient will continue with his home dose of Keppra and advised to follow-up with his neurologist.  He will continue rest of his home meds and follow-up with his primary care  physician.  Discharge Diagnoses:  Principal Problem:   AMS (altered mental status) Active Problems:   Cellulitis   Acute systolic CHF (congestive heart failure) (HCC)   Essential hypertension   Cognitive impairment   GERD (gastroesophageal reflux disease)   Anemia   DNR (do not resuscitate)  Discharge Instructions  Discharge Instructions    Diet - low sodium heart healthy   Complete by: As directed    Discharge instructions   Complete by: As directed    It was pleasure taking care of you. Increases the dose of Lasix to 40 mg daily and added another medicine called losartan which should help with your heart failure and blood pressure. Please follow-up with your cardiologist and neurologist. Continue taking your Keppra until you will see your neurologist, they can make changes if needed.   Increase activity slowly   Complete by: As directed      Allergies as of 08/31/2019      Reactions   Amiodarone Rash      Medication List    STOP taking these medications   amoxicillin 500 MG tablet Commonly known as: AMOXIL     TAKE these medications   aspirin 81 MG chewable tablet Chew 1 tablet (81 mg total) by mouth daily. Start taking on: September 01, 2019   atenolol 25 MG tablet Commonly known as: TENORMIN Take 12.5 mg by mouth daily.   donepezil 10 MG tablet Commonly known as: ARICEPT Take 10 mg by mouth at bedtime.   furosemide 40 MG tablet Commonly known as: LASIX Take 1 tablet (40 mg total) by mouth daily. What changed:   medication strength  how much to take   isosorbide mononitrate 30 MG 24  hr tablet Commonly known as: IMDUR Take 30 mg by mouth daily. Hold for SBP<90 and /or MAP<70   levETIRAcetam 500 MG tablet Commonly known as: KEPPRA Take 1 tablet (500 mg total) by mouth 2 (two) times daily.   losartan 25 MG tablet Commonly known as: COZAAR Take 1 tablet (25 mg total) by mouth daily. Start taking on: September 01, 2019   omeprazole 20 MG  capsule Commonly known as: PRILOSEC Take 20 mg by mouth daily.       Allergies  Allergen Reactions  . Amiodarone Rash    Consultations:  Curbside cardiology  Procedures/Studies: EEG  Result Date: 08/30/2019 Charlsie Quest, MD     08/30/2019  2:32 PM Patient Name: Reco Shonk MRN: [id number] Epilepsy Attending: Charlsie Quest Referring Physician/Provider: Dr Arnetha Courser Date: 08/30/2019 Duration: 23.19 mins Patient history: 86 year old male with history of epilepsy presented with altered mental status.  EEG to evaluate for seizures. Level of alertness: Awake, asleep AEDs during EEG study: Keppra Technical aspects: This EEG study was done with scalp electrodes positioned according to the 10-20 International system of electrode placement. Electrical activity was acquired at a sampling rate of 500Hz  and reviewed with a high frequency filter of 70Hz  and a low frequency filter of 1Hz . EEG data were recorded continuously and digitally stored. Description: During awake state, no clear posterior dominant rhythm was seen.  Sleep was characterized by vertex waves, sleep spindles (12 to 14 Hz), maximal frontocentral region.  EEG showed continuous generalized polymorphic 5 to 7 Hz theta slowing.  Hyperventilation photic stimulation were not performed. Abnormalities - Continuous slow, generalized    IMPRESSION: This study is suggestive of mild diffuse encephalopathy, nonspecific etiology. No seizures or epileptiform discharges were seen throughout the recording.   DG Chest 2 View  Result Date: 08/29/2019 CLINICAL DATA:  Shortness of breath. EXAM: CHEST - 2 VIEW COMPARISON:  December 19, 2017. FINDINGS: Stable cardiomegaly. Atherosclerosis of thoracic aorta is noted. Left-sided pacemaker is unchanged. Status post aortic valve repair. No pneumothorax is noted. Mild bibasilar subsegmental atelectasis or edema is noted with probable small pleural effusions. Bony thorax is unremarkable.  IMPRESSION: Mild bibasilar subsegmental atelectasis or edema is noted with probable small pleural effusions. Aortic Atherosclerosis (ICD10-I70.0). Electronically Signed   By: Charlsie Quest M.D.   On: 08/29/2019 17:00   December 21, 2017 Venous Img Lower Unilateral Left  Result Date: 08/29/2019 CLINICAL DATA:  Left lower extremity edema and pain EXAM: LEFT LOWER EXTREMITY VENOUS DOPPLER ULTRASOUND TECHNIQUE: Gray-scale sonography with graded compression, as well as color Doppler and duplex ultrasound were performed to evaluate the lower extremity deep venous systems from the level of the common femoral vein and including the common femoral, femoral, profunda femoral, popliteal and calf veins including the posterior tibial, peroneal and gastrocnemius veins when visible. The superficial great saphenous vein was also interrogated. Spectral Doppler was utilized to evaluate flow at rest and with distal augmentation maneuvers in the common femoral, femoral and popliteal veins. COMPARISON:  None. FINDINGS: Contralateral Common Femoral Vein: Respiratory phasicity is normal and symmetric with the symptomatic side. No evidence of thrombus. Normal compressibility. Common Femoral Vein: No evidence of thrombus. Normal compressibility, respiratory phasicity and response to augmentation. Saphenofemoral Junction: No evidence of thrombus. Normal compressibility and flow on color Doppler imaging. Profunda Femoral Vein: No evidence of thrombus. Normal compressibility and flow on color Doppler imaging. Femoral Vein: No evidence of thrombus. Normal compressibility, respiratory phasicity and response to augmentation. Popliteal Vein: No evidence  of thrombus. Normal compressibility, respiratory phasicity and response to augmentation. Calf Veins: No evidence of thrombus. Normal compressibility and flow on color Doppler imaging. Other Findings: Left popliteal fossa minimally complex thick-walled Baker's cyst measures 3.3 x 1.1 x 3.5 cm. Minor  peripheral extremity edema noted. IMPRESSION: No evidence of deep venous thrombosis. 3.5 cm left popliteal fossa Baker's cyst. Electronically Signed   By: Jerilynn Mages.  Shick M.D.   On: 08/29/2019 16:46   ECHOCARDIOGRAM COMPLETE  Result Date: 08/31/2019    ECHOCARDIOGRAM REPORT   Patient Name:   DEARIS DANIS Date of Exam: 08/30/2019 Medical Rec #:  [id number]     Height:       72.0 in Accession #:    [id number]    Weight:       194.9 lb Date of Birth:  [birth year]      BSA:          2.107 m Patient Age:    69 years      BP:           128/85 mmHg Patient Gender: M             HR:           88 bpm. Exam Location:  [facility] Procedure: 2D Echo Indications:     CHF-ACUTE SYSTOLIC 382.50/N39.76  History:         Patient has no prior history of Echocardiogram examinations.                  Risk Factors:Hypertension.  Sonographer:     Avanell Shackleton Referring Phys:  [id number] Shraga Custard Diagnosing Phys: Serafina Royals MD IMPRESSIONS  1. Left ventricular ejection fraction, by estimation, is 35 to 40%. The left ventricle has moderately decreased function. The left ventricle demonstrates global hypokinesis. The left ventricular internal cavity size was mildly dilated. There is mild left ventricular hypertrophy. Left ventricular diastolic parameters were normal.  2. Right ventricular systolic function is normal. The right ventricular size is normal. There is severely elevated pulmonary artery systolic pressure.  3. Left atrial size was mild to moderately dilated.  4. Right atrial size was mild to moderately dilated.  5. The mitral valve is normal in structure. Moderate mitral valve regurgitation.  6. Tricuspid valve regurgitation is mild to moderate.  7. The aortic valve has been repaired/replaced. Aortic valve regurgitation is trivial. Mild aortic valve sclerosis is present, with no evidence of aortic valve stenosis. FINDINGS  Left Ventricle: Left ventricular ejection fraction, by estimation, is 35 to 40%. The left ventricle has moderately  decreased function. The left ventricle demonstrates global hypokinesis. The left ventricular internal cavity size was mildly dilated. There is mild left ventricular hypertrophy. Left ventricular diastolic parameters were normal. Right Ventricle: The right ventricular size is normal. No increase in right ventricular wall thickness. Right ventricular systolic function is normal. There is severely elevated pulmonary artery systolic pressure. The tricuspid regurgitant velocity is 3.89 m/s, and with an assumed right atrial pressure of 10 mmHg, the estimated right ventricular systolic pressure is 35.3 mmHg. Left Atrium: Left atrial size was mild to moderately dilated. Right Atrium: Right atrial size was mild to moderately dilated. Pericardium: There is no evidence of pericardial effusion. Mitral Valve: The mitral valve is normal in structure. Moderate mitral valve regurgitation. Tricuspid Valve: The tricuspid valve is normal in structure. Tricuspid valve regurgitation is mild to moderate. Aortic Valve: The aortic valve has been repaired/replaced. Aortic valve regurgitation is trivial. Mild aortic valve sclerosis is present, with  no evidence of aortic valve stenosis. Aortic valve mean gradient measures 15.5 mmHg. Aortic valve peak gradient  measures 24.0 mmHg. Aortic valve area, by VTI measures 0.80 cm. There is a bioprosthetic valve present in the aortic position. Pulmonic Valve: The pulmonic valve was normal in structure. Pulmonic valve regurgitation is trivial. Aorta: The aortic root is normal in size and structure. IAS/Shunts: No atrial level shunt detected by color flow Doppler.  LEFT VENTRICLE PLAX 2D LVIDd:         4.37 cm LVIDs:         3.42 cm LV PW:         1.05 cm LV IVS:        1.29 cm LVOT diam:     2.20 cm LV SV:         44 LV SV Index:   21 LVOT Area:     3.80 cm  IVC IVC diam: 2.84 cm LEFT ATRIUM              Index       RIGHT ATRIUM           Index LA diam:        5.10 cm  2.42 cm/m  RA Area:     26.70  cm LA Vol (A2C):   124.0 ml 58.84 ml/m RA Volume:   92.80 ml  44.04 ml/m LA Vol (A4C):   91.7 ml  43.52 ml/m LA Biplane Vol: 113.0 ml 53.62 ml/m  AORTIC VALVE AV Area (Vmax):    0.82 cm AV Area (Vmean):   0.82 cm AV Area (VTI):     0.80 cm AV Vmax:           245.00 cm/s AV Vmean:          185.250 cm/s AV VTI:            0.548 m AV Peak Grad:      24.0 mmHg AV Mean Grad:      15.5 mmHg LVOT Vmax:         52.80 cm/s LVOT Vmean:        39.900 cm/s LVOT VTI:          0.116 m LVOT/AV VTI ratio: 0.21  AORTA Ao Root diam: 3.40 cm MITRAL VALVE                TRICUSPID VALVE MV Area (PHT): 3.99 cm     TR Peak grad:   60.5 mmHg MV Decel Time: 190 msec     TR Vmax:        389.00 cm/s MV E velocity: 131.00 cm/s MV A velocity: 51.40 cm/s   SHUNTS MV E/A ratio:  2.55         Systemic VTI:  0.12 m                             Systemic Diam: 2.20 cm Arnoldo Hooker MD Electronically signed by Arnoldo Hooker MD Signature Date/Time: 08/31/2019/7:45:59 AM    Final     Subjective: Patient was feeling better when seen today.  No new complaints.  No chest pain or shortness of breath.  Left lower extremity edema and erythema is improving.  Discharge Exam: Vitals:   08/31/19 0546 08/31/19 1100  BP: 133/90 113/81  Pulse: 63 63  Resp: 16 19  Temp: 97.6 F (36.4 C) 97.7 F (36.5 C)  SpO2: 97% 97%   Vitals:  08/30/19 1942 08/31/19 0546 08/31/19 0634 08/31/19 1100  BP: 128/89 133/90  113/81  Pulse: 88 63  63  Resp: 19 16  19   Temp: (!) 97.4 F (36.3 C) 97.6 F (36.4 C)  97.7 F (36.5 C)  TempSrc: Oral Oral  Oral  SpO2: 93% 97%  97%  Weight:   87 kg   Height:        General: Pt is alert, awake, not in acute distress Cardiovascular: RRR, S1/S2 +, no rubs, no gallops Respiratory: CTA bilaterally, no wheezing, no rhonchi Abdominal: Soft, NT, ND, bowel sounds + Extremities: no edema, no cyanosis   The results of significant diagnostics from this hospitalization (including imaging, microbiology, ancillary  and laboratory) are listed below for reference.    Microbiology: Recent Results (from the past 240 hour(s))  SARS CORONAVIRUS 2 (TAT 6-24 HRS) Nasopharyngeal Nasopharyngeal Swab     Status: None   Collection Time: 08/29/19  6:41 PM   Specimen: Nasopharyngeal Swab  Result Value Ref Range Status   SARS Coronavirus 2 NEGATIVE NEGATIVE Final    Comment: (NOTE) SARS-CoV-2 target nucleic acids are NOT DETECTED. The SARS-CoV-2 RNA is generally detectable in upper and lower respiratory specimens during the acute phase of infection. Negative results do not preclude SARS-CoV-2 infection, do not rule out co-infections with other pathogens, and should not be used as the sole basis for treatment or other patient management decisions. Negative results must be combined with clinical observations, patient history, and epidemiological information. The expected result is Negative. Fact Sheet for Patients: HairSlick.no Fact Sheet for Healthcare Providers: quierodirigir.com This test is not yet approved or cleared by the [place] FDA and  has been authorized for detection and/or diagnosis of SARS-CoV-2 by FDA under an Emergency Use Authorization (EUA). This EUA will remain  in effect (meaning this test can be used) for the duration of the COVID-19 declaration under Section 56 4(b)(1) of the Act, 21 U.S.C. section 360bbb-3(b)(1), unless the authorization is terminated or revoked sooner. Performed at [facility], [address], [place], [place] [address]   MRSA PCR Screening     Status: None   Collection Time: 08/30/19  8:44 PM   Specimen: Nasopharyngeal  Result Value Ref Range Status   MRSA by PCR NEGATIVE NEGATIVE Final    Comment:        The GeneXpert MRSA Assay (FDA approved for NASAL specimens only), is one component of a comprehensive MRSA colonization surveillance program. It is not intended to diagnose MRSA infection nor  to guide or monitor treatment for MRSA infections. Performed at [facility], [address] [place], [place] [address]      Labs: BNP (last 3 results) Recent Labs    08/29/19 1516  BNP 535.0*   Basic Metabolic Panel: Recent Labs  Lab 08/29/19 1516 08/30/19 0323 08/31/19 0521  NA 138 141 139  K 4.3 3.9 4.0  CL 108 106 104  CO2 24 25 26   GLUCOSE 90 96 91  BUN 28* 30* 33*  CREATININE 1.08 1.16 1.22  CALCIUM 9.4 9.2 9.2   Liver Function Tests: Recent Labs  Lab 08/30/19 0323  AST 30  ALT 17  ALKPHOS 116  BILITOT 1.2  PROT 7.4  ALBUMIN 3.7   No results for input(s): LIPASE, AMYLASE in the last 168 hours. No results for input(s): AMMONIA in the last 168 hours. CBC: Recent Labs  Lab 08/29/19 1516 08/30/19 0323 08/31/19 0521  WBC 6.4 6.2 6.2  NEUTROABS 4.0  --   --  HGB 11.5* 11.9* 12.1*  HCT 36.0* 36.4* 36.9*  MCV 96.8 95.0 94.1  PLT 191 175 177   Cardiac Enzymes: No results for input(s): CKTOTAL, CKMB, CKMBINDEX, TROPONINI in the last 168 hours. BNP: Invalid input(s): POCBNP CBG: No results for input(s): GLUCAP in the last 168 hours. D-Dimer No results for input(s): DDIMER in the last 72 hours. Hgb A1c No results for input(s): HGBA1C in the last 72 hours. Lipid Profile No results for input(s): CHOL, HDL, LDLCALC, TRIG, CHOLHDL, LDLDIRECT in the last 72 hours. Thyroid function studies Recent Labs    08/30/19 0323  TSH 4.186   Anemia work up No results for input(s): VITAMINB12, FOLATE, FERRITIN, TIBC, IRON, RETICCTPCT in the last 72 hours. Urinalysis    Component Value Date/Time   COLORURINE STRAW (A) 12/19/2017 1717   APPEARANCEUR CLEAR (A) 12/19/2017 1717   LABSPEC 1.005 12/19/2017 1717   PHURINE 7.0 12/19/2017 1717   GLUCOSEU NEGATIVE 12/19/2017 1717   HGBUR NEGATIVE 12/19/2017 1717   BILIRUBINUR NEGATIVE 12/19/2017 1717   KETONESUR NEGATIVE 12/19/2017 1717   PROTEINUR NEGATIVE 12/19/2017 1717   NITRITE NEGATIVE 12/19/2017  1717   LEUKOCYTESUR NEGATIVE 12/19/2017 1717   Sepsis Labs Invalid input(s): PROCALCITONIN,  WBC,  LACTICIDVEN Microbiology Recent Results (from the past 240 hour(s))  SARS CORONAVIRUS 2 (TAT 6-24 HRS) Nasopharyngeal Nasopharyngeal Swab     Status: None   Collection Time: 08/29/19  6:41 PM   Specimen: Nasopharyngeal Swab  Result Value Ref Range Status   SARS Coronavirus 2 NEGATIVE NEGATIVE Final    Comment: (NOTE) SARS-CoV-2 target nucleic acids are NOT DETECTED. The SARS-CoV-2 RNA is generally detectable in upper and lower respiratory specimens during the acute phase of infection. Negative results do not preclude SARS-CoV-2 infection, do not rule out co-infections with other pathogens, and should not be used as the sole basis for treatment or other patient management decisions. Negative results must be combined with clinical observations, patient history, and epidemiological information. The expected result is Negative. Fact Sheet for Patients: HairSlick.no Fact Sheet for Healthcare Providers: quierodirigir.com This test is not yet approved or cleared by the [place] FDA and  has been authorized for detection and/or diagnosis of SARS-CoV-2 by FDA under an Emergency Use Authorization (EUA). This EUA will remain  in effect (meaning this test can be used) for the duration of the COVID-19 declaration under Section 56 4(b)(1) of the Act, 21 U.S.C. section 360bbb-3(b)(1), unless the authorization is terminated or revoked sooner. Performed at [facility], [address], [place], [place] [address]   MRSA PCR Screening     Status: None   Collection Time: 08/30/19  8:44 PM   Specimen: Nasopharyngeal  Result Value Ref Range Status   MRSA by PCR NEGATIVE NEGATIVE Final    Comment:        The GeneXpert MRSA Assay (FDA approved for NASAL specimens only), is one component of a comprehensive MRSA  colonization surveillance program. It is not intended to diagnose MRSA infection nor to guide or monitor treatment for MRSA infections. Performed at [facility], [address] [place], [place] [address]     Time coordinating discharge: Over 30 minutes  SIGNED:  Arnetha Courser, MD  [facility] 08/31/2019, 2:23 PM  If 7PM-7AM, please contact night-coverage [email or web address]  This record has been created using Conservation officer, historic buildings. Errors have been sought and corrected,but may not always be located. Such creation errors do not reflect on the standard of care.

## 2019-08-31 NOTE — Discharge Instructions (Signed)
Shortness of Breath, Adult Shortness of breath is when a person has trouble breathing enough air or when a person feels like she or he is having trouble breathing in enough air. Shortness of breath could be a sign of a medical problem. Follow these instructions at home:   Pay attention to any changes in your symptoms.  Do not use any products that contain nicotine or tobacco, such as cigarettes, e-cigarettes, and chewing tobacco.  Do not smoke. Smoking is a common cause of shortness of breath. If you need help quitting, ask your health care provider.  Avoid things that can irritate your airways, such as: ? Mold. ? Dust. ? Air pollution. ? Chemical fumes. ? Things that can cause allergy symptoms (allergens), if you have allergies.  Keep your living space clean and free of mold and dust.  Rest as needed. Slowly return to your usual activities.  Take over-the-counter and prescription medicines only as told by your health care provider. This includes oxygen therapy and inhaled medicines.  Keep all follow-up visits as told by your health care provider. This is important. Contact a health care provider if:  Your condition does not improve as soon as expected.  You have a hard time doing your normal activities, even after you rest.  You have new symptoms. Get help right away if:  Your shortness of breath gets worse.  You have shortness of breath when you are resting.  You feel light-headed or you faint.  You have a cough that is not controlled with medicines.  You cough up blood.  You have pain with breathing.  You have pain in your chest, arms, shoulders, or abdomen.  You have a fever.  You cannot walk up stairs or exercise the way that you normally do. These symptoms may represent a serious problem that is an emergency. Do not wait to see if the symptoms will go away. Get medical help right away. Call your local emergency services (911 in the [place] Do not drive yourself  to the hospital. Summary  Shortness of breath is when a person has trouble breathing enough air. It can be a sign of a medical problem.  Avoid things that irritate your lungs, such as smoking, pollution, mold, and dust.  Pay attention to changes in your symptoms and contact your health care provider if you have a hard time completing daily activities because of shortness of breath. This information is not intended to replace advice given to you by your health care provider. Make sure you discuss any questions you have with your health care provider. Document Revised: 11/06/2017 Document Reviewed: 11/06/2017 Elsevier Patient Education  2020 Elsevier Inc.  

## 2019-08-31 NOTE — Plan of Care (Signed)
  Problem: Safety: Goal: Ability to remain free from injury will improve Outcome: Not Progressing   

## 2019-09-02 LAB — LEVETIRACETAM LEVEL: Levetiracetam Lvl: 24.5 ug/mL (ref 10.0–40.0)

## 2019-12-19 DEATH — deceased

## 2020-07-21 DEATH — deceased

## 2021-02-06 IMAGING — CR DG CHEST 2V
1 series · 2 of 2 positions shown · non-contrast
Comparison: December 19, 2017.

CLINICAL DATA: Shortness of breath.

EXAM:
CHEST - 2 VIEW

[Series 1: dg chest 2 view · 0.14mm/px · 2 of 2 slices shown]
[im 1/2]
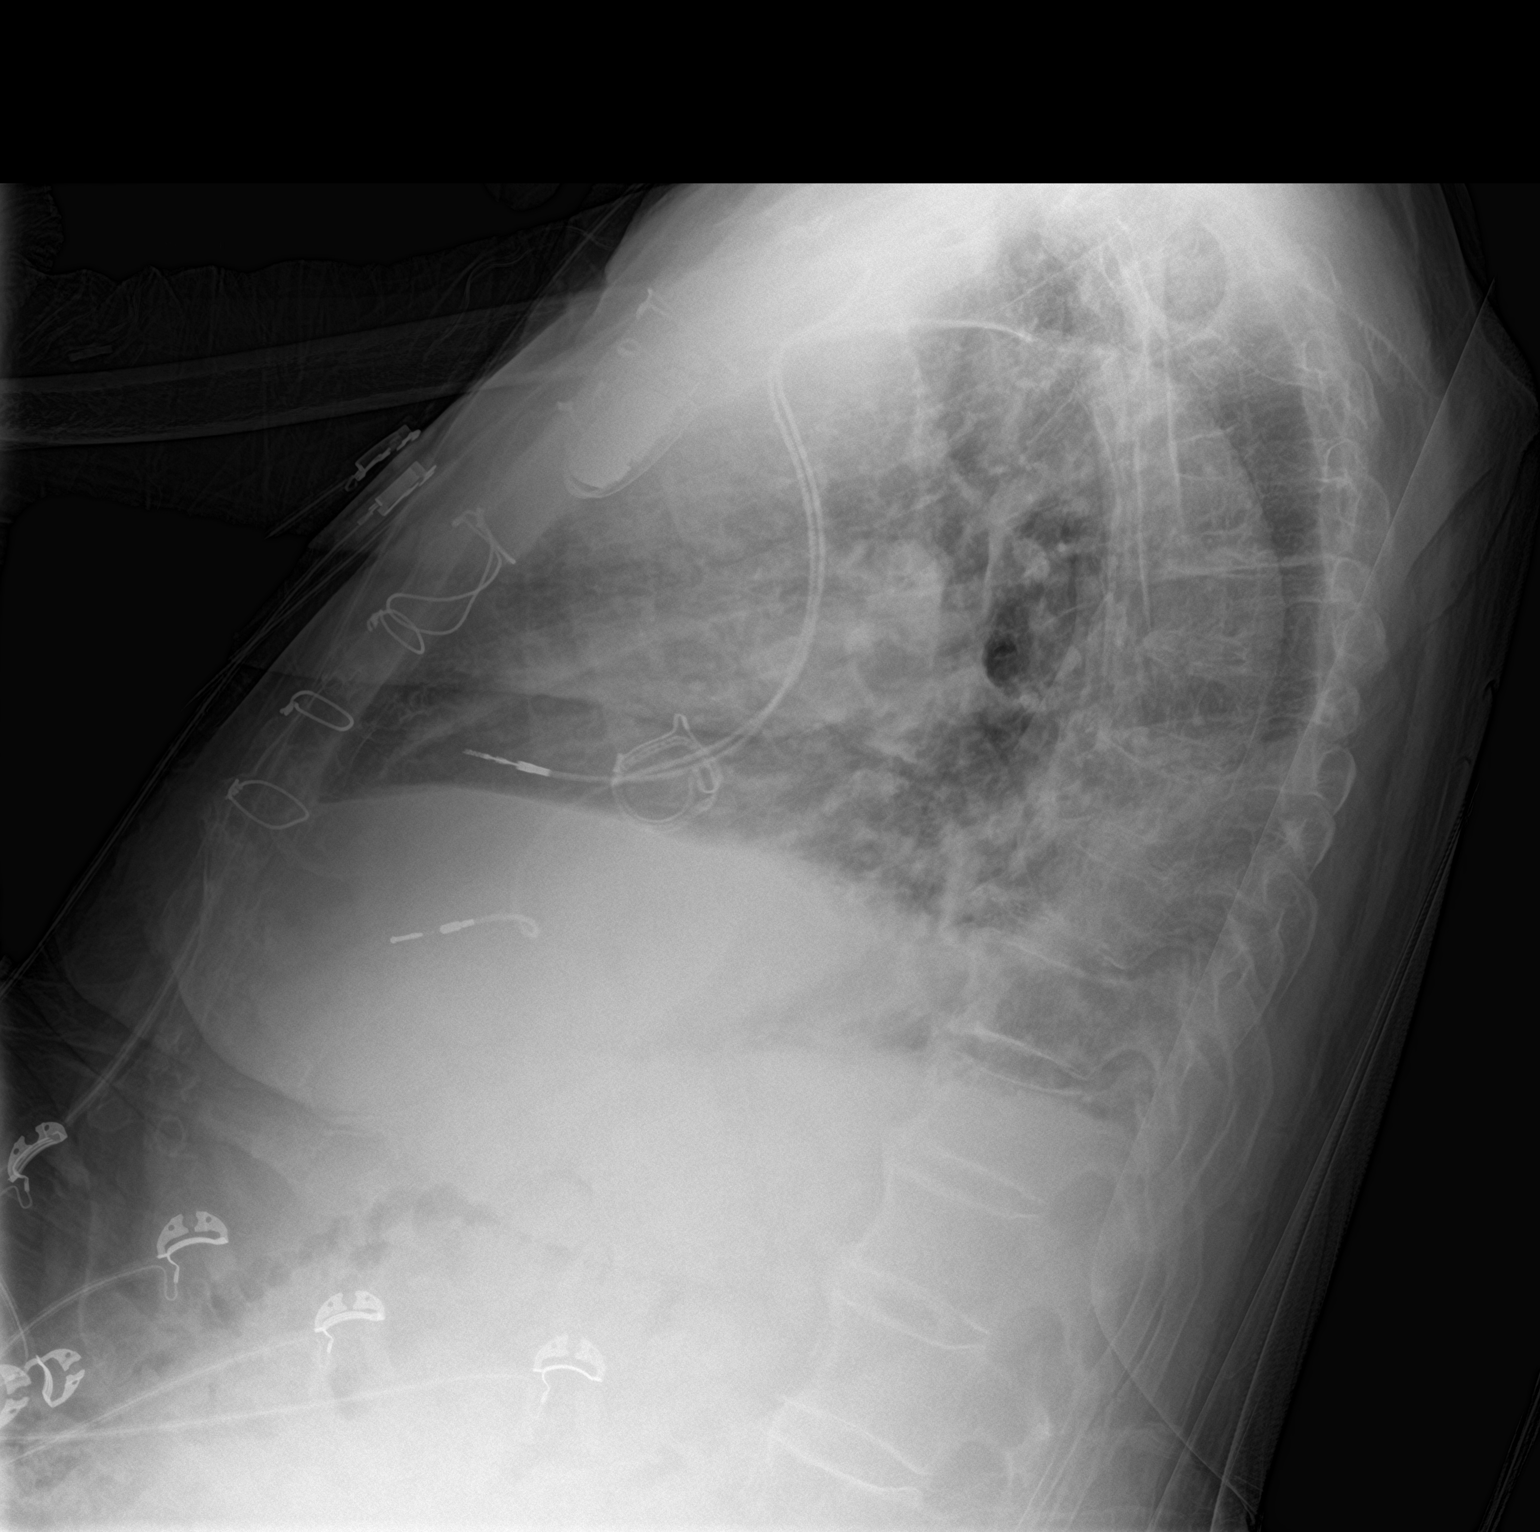
[im 2/2]
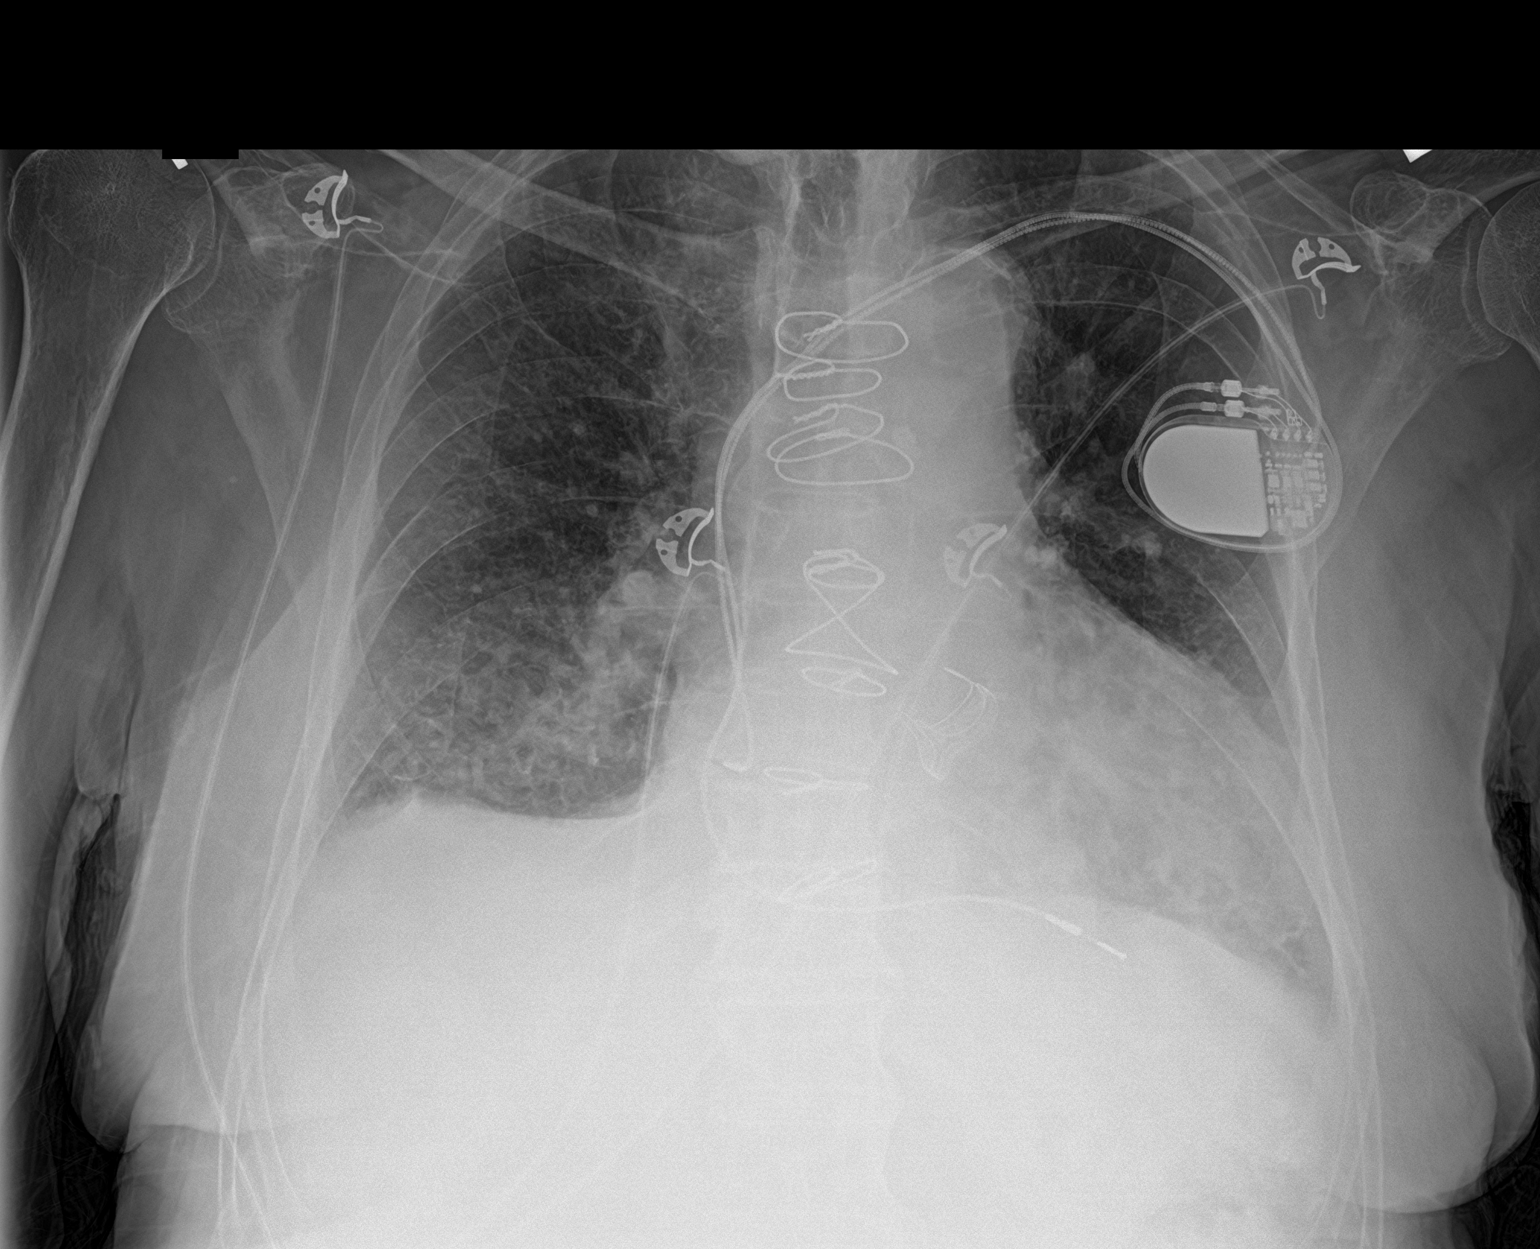

[2 of 2 positions shown; findings below may reference images not displayed]

FINDINGS: Stable cardiomegaly. Atherosclerosis of thoracic aorta is noted.
Left-sided pacemaker is unchanged. Status post aortic valve repair.
No pneumothorax is noted. Mild bibasilar subsegmental atelectasis or
edema is noted with probable small pleural effusions. Bony thorax is
unremarkable.
IMPRESSION: Mild bibasilar subsegmental atelectasis or edema is noted with
probable small pleural effusions.

Aortic Atherosclerosis ([id number]).
